# Patient Record
Sex: Male | Born: 1965 | Race: Black or African American | Hispanic: No | Marital: Single
Health system: Midwestern US, Community
[De-identification: ages and names within clinical notes are randomized; demographics above are authoritative.]

## PROBLEM LIST (undated history)

## (undated) DIAGNOSIS — Z87891 Personal history of nicotine dependence: Secondary | ICD-10-CM

## (undated) DIAGNOSIS — Z973 Presence of spectacles and contact lenses: Secondary | ICD-10-CM

## (undated) DIAGNOSIS — K0510 Chronic gingivitis, plaque induced: Secondary | ICD-10-CM

## (undated) DIAGNOSIS — R9431 Abnormal electrocardiogram [ECG] [EKG]: Secondary | ICD-10-CM

## (undated) DIAGNOSIS — Z720 Tobacco use: Secondary | ICD-10-CM

## (undated) HISTORY — DX: Tobacco use: Z72.0

## (undated) HISTORY — DX: Presence of spectacles and contact lenses: Z97.3

## (undated) HISTORY — DX: Abnormal electrocardiogram (ECG) (EKG): R94.31

## (undated) HISTORY — DX: Chronic gingivitis, plaque induced: K05.10

## (undated) HISTORY — DX: Personal history of nicotine dependence: Z87.891

---

## 2012-03-02 DIAGNOSIS — R9431 Abnormal electrocardiogram [ECG] [EKG]: Secondary | ICD-10-CM

## 2012-03-02 HISTORY — DX: Abnormal electrocardiogram (ECG) (EKG): R94.31

## 2012-07-27 ENCOUNTER — Ambulatory Visit
Admission: RE | Admit: 2012-07-27 | Discharge: 2012-07-27 | Disposition: A | Discharge status: Home / Self Care | Payer: BC Managed Care – PPO | Source: Ambulatory Visit | Attending: Medical | Admitting: Medical

## 2012-07-27 ENCOUNTER — Ambulatory Visit (INDEPENDENT_AMBULATORY_CARE_PROVIDER_SITE_OTHER): Payer: BC Managed Care – PPO | Admitting: Medical

## 2012-07-27 ENCOUNTER — Encounter: Payer: Self-pay | Admitting: Medical

## 2012-07-27 VITALS — BP 132/90 | HR 78 | Temp 98.0°F | Resp 16 | Ht 70.5 in | Wt 207.0 lb

## 2012-07-27 DIAGNOSIS — F17200 Nicotine dependence, unspecified, uncomplicated: Secondary | ICD-10-CM

## 2012-07-27 DIAGNOSIS — R079 Chest pain, unspecified: Secondary | ICD-10-CM

## 2012-07-27 DIAGNOSIS — N529 Male erectile dysfunction, unspecified: Secondary | ICD-10-CM

## 2012-07-27 DIAGNOSIS — Z0000 Encounter for general adult medical examination without abnormal findings: Secondary | ICD-10-CM

## 2012-07-27 DIAGNOSIS — Z23 Encounter for immunization: Secondary | ICD-10-CM

## 2012-07-27 DIAGNOSIS — K0510 Chronic gingivitis, plaque induced: Secondary | ICD-10-CM

## 2012-07-27 DIAGNOSIS — R209 Unspecified disturbances of skin sensation: Secondary | ICD-10-CM

## 2012-07-27 DIAGNOSIS — R202 Paresthesia of skin: Secondary | ICD-10-CM

## 2012-07-27 LAB — POCT URINALYSIS DIPSTICK
Ketones, UA: NEGATIVE
Protein, UA: NEGATIVE
Spec Grav, UA: <=1.005
pH, UA: 5

## 2012-07-27 NOTE — Progress Notes (Signed)
Subjective:   HPI  Glen [name] is a 47 y.o. male who presents for a complete physical.  New patient today.  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  Has a [place] address and this is [place] his [place], but mostly out of town all the time.  Preventative care: Last ophthalmology visit:yes- [facility] Last dental visit:n/a Last colonoscopy:n/a Last prostate exam: n/a Last EKG:07/27/12 Last labs:  Prior vaccinations: TD or Tdap:07/27/12 Influenza:n/a Pneumococcal:n/a Shingles/Zostavax:n/a Other:   Advanced directive:n/a Health care power of attorney:n/a Living will:n/a  Concerns: BP elevated of recent - has home cuff.  From time to time getting 150/100s.  Not sure if his cuff is accurate.  Just passed DOT physical 8/13.   Chest pain - in hte past 30mo has had a few intermittent episodes of central chest pain.  Happens at random, not associated with activity, no associated heartburn, SOB, sweats, nausea, tingling, numbness, not particularly having symptoms with activity.  No prior similar.  Goes away after a few minutes.  Sometimes worse with deep breathing.  ED, premature ejaculation - ongoing, sometimes has issues getting full erections too.  No prior ED visits for the same.  Does get some morning erections.  The full erection is not as big as an issue as the premature ejaculation.  Overactive bladder - urinates often in general, day and night.   No prior eval, no hematuria, no odor or abnormal color to urine  Tobacco use - wants to stop.   Cut out cigarettes, been using e-cigarettes. No prior prescription medication to help quit.  Reviewed their medical, surgical, family, social, medication, and allergy history and updated chart as appropriate.   Past Medical History  Diagnosis Date  . Wears glasses     History reviewed. No pertinent past surgical history.  Family History  Problem Relation Age of Onset  . Cancer  Mother     multiple myeloma  . Cancer Father 22    colon  . Thyroid disease Sister   . Hypertension Brother   . Hypertension Paternal Grandmother   . Heart disease Neg Hx   . Stroke Neg Hx   . Diabetes Neg Hx     History   Social History  . Marital Status: Single    Spouse Name: N/A    Number of Children: N/A  . Years of Education: N/A   Occupational History  . Not on file.   Social History Main Topics  . Smoking status: Former Smoker -- 1.00 packs/day for 20 years    Types: Cigarettes    Quit date: 05/02/2012  . Smokeless tobacco: Current User     Comment: does smoke e-cigarettes  . Alcohol Use: Yes     Comment: periodic beers, not weekly  . Drug Use: No  . Sexually Active: Not on file   Other Topics Concern  . Not on file   Social History Narrative   Agnostic.  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   Truck driver.   [employer].   Exercise - limited    No current outpatient prescriptions on file prior to visit.   No current facility-administered medications on file prior to visit.    No Known Allergies   Review of Systems Constitutional: -fever, -chills, -sweats, -unexpected weight change, -decreased appetite, -fatigue Allergy: -sneezing, -itching, -congestion Dermatology: -changing moles, --rash, -lumps ENT: -runny nose, -ear pain, -sore throat, -hoarseness, -  sinus pain, +teeth pain, - ringing in ears, -hearing loss, -nosebleeds Cardiology: +chest pain, -palpitations, -swelling, -difficulty breathing when lying flat, -waking up short of breath Respiratory: -cough, -shortness of breath, -difficulty breathing with exercise or exertion, -wheezing, -coughing up blood Gastroenterology: -abdominal pain, -nausea, -vomiting, -diarrhea, -constipation, -blood in stool, -changes in bowel movement, -difficulty swallowing or eating Hematology: -bleeding, -bruising  Musculoskeletal: -joint aches, -muscle aches, -joint swelling, -back  pain, -neck pain, -cramping, -changes in gait Ophthalmology: denies vision changes, eye redness, itching, discharge Urology: -burning with urination, -difficulty urinating, -blood in urine, +urinary frequency, -urgency, -incontinence Neurology: +headache, -weakness, -tingling, +numbness, -memory loss, -falls, -dizziness Psychology: -depressed mood, -agitation, +sleep problems     Objective:   Physical Exam  Nurse notes and vitals reviewed  General appearance: alert, no distress, WD/WN, [place], mildly overweight Skin:tattoos left arm, few scattered benign appearing macule, no worrisome lesions HEENT: normocephalic, conjunctiva/corneas normal, sclerae anicteric, PERRLA, EOMi, nares patent, no discharge or erythema, pharynx normal Oral cavity: MMM, tongue normal, teeth with obvious stain, some decay, moderate gingival disease Neck: supple, no lymphadenopathy, no thyromegaly, no masses, normal ROM, no bruits Chest: non tender, normal shape and expansion Heart: RRR, normal S1, S2, no murmurs Lungs: CTA bilaterally, no wheezes, rhonchi, or rales Abdomen: +bs, soft, non tender, non distended, no masses, no hepatomegaly, no splenomegaly, no bruits Back: non tender, normal ROM, no scoliosis Musculoskeletal: upper extremities non tender, no obvious deformity, normal ROM throughout, lower extremities non tender, no obvious deformity, normal ROM throughout Extremities: no edema, no cyanosis, no clubbing Pulses: 2+ symmetric, upper and lower extremities, normal cap refill Neurological: alert, oriented x 3, CN2-12 intact, strength normal upper extremities and lower extremities, sensation normal throughout, DTRs 2+ throughout, no cerebellar signs, gait normal Psychiatric: normal affect, behavior normal, pleasant  GU: normal male external genitalia,circumcised, nontender, no masses, no hernia, no lymphadenopathy Rectal: normal tone, prostate WNL, no lesions   Adult ECG Report  Indication: chest pain   Rate: 62 bpm  Rhythm: normal sinus rhythm  QRS Axis: 21 degrees  PR Interval:  QRS Duration: 92ms  QTc:  Conduction Disturbances: first-degree A-V block   Other Abnormalities: none  Patient's cardiac risk factors are: male gender and smoking/ tobacco exposure.  EKG comparison: none  Narrative Interpretation: 1st degree AV block, no other acute changes     Assessment and Plan :      Encounter Diagnoses  Name Primary?  . Routine general medical examination at a health care facility Yes  . Need for Tdap vaccination   . Gingivitis   . Chest pain   . Tobacco use disorder   . Paresthesia   . Erectile dysfunction     Physical exam - discussed healthy lifestyle, diet, exercise, preventative care, vaccinations, and addressed their concerns.    tdap vaccine, VIS and counseling given  Gingivitis - advised dentist f/u  Chest pain - etiology vague, unclear.  No obvious cause.  reviewed EKG, will send for CXR, f/u pending labs  Tobacco use - discussed risks, ways to quit, advised he consider counseling through toll free the hotline and begin OTC nicotine gum  Paresthesia - left ring finger.  Etiology unclear.   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   discusses ways to deal with premature ejaculation.  Advised against medications for now.     Follow-up pending CXR, labs

## 2012-07-28 LAB — LIPID PANEL
Cholesterol: 205 mg/dL — ABNORMAL HIGH (ref 0–200)
HDL: 46 mg/dL (ref >39)
Total CHOL/HDL Ratio: 4.5 Ratio
Triglycerides: 84 mg/dL (ref <150)

## 2012-07-28 LAB — CBC WITH DIFFERENTIAL/PLATELET
Eosinophils Relative: 1 % (ref 0–5)
Lymphocytes Relative: 35 % (ref 12–46)
Lymphs Abs: 2.5 10*3/uL (ref 0.7–4.0)
MCV: 79.1 fL (ref 78.0–100.0)
Neutro Abs: 4.1 10*3/uL (ref 1.7–7.7)
Platelets: 274 10*3/uL (ref 150–400)
RBC: 5.13 MIL/uL (ref 4.22–5.81)
WBC: 7.1 10*3/uL (ref 4.0–10.5)

## 2012-07-28 LAB — COMPREHENSIVE METABOLIC PANEL
CO2: 23 mEq/L (ref 19–32)
Creat: 0.95 mg/dL (ref 0.50–1.35)
Glucose, Bld: 98 mg/dL (ref 70–99)
Total Bilirubin: 1.1 mg/dL (ref 0.3–1.2)
Total Protein: 7.3 g/dL (ref 6.0–8.3)

## 2012-07-28 LAB — TSH: TSH: 1.168 u[IU]/mL (ref 0.350–4.500)

## 2012-11-22 ENCOUNTER — Ambulatory Visit: Payer: BC Managed Care – PPO | Admitting: Family Medicine

## 2012-11-24 ENCOUNTER — Ambulatory Visit: Payer: BC Managed Care – PPO | Admitting: Family Medicine

## 2012-12-02 ENCOUNTER — Ambulatory Visit (INDEPENDENT_AMBULATORY_CARE_PROVIDER_SITE_OTHER): Payer: BC Managed Care – PPO | Admitting: Family Medicine

## 2012-12-02 ENCOUNTER — Encounter: Payer: Self-pay | Admitting: Family Medicine

## 2012-12-02 VITALS — BP 120/78 | HR 70 | Wt 204.0 lb

## 2012-12-02 DIAGNOSIS — L989 Disorder of the skin and subcutaneous tissue, unspecified: Secondary | ICD-10-CM

## 2012-12-02 NOTE — Progress Notes (Signed)
  Subjective:    Patient ID: Glen [name], male    DOB: 05/15/1965, 47 y.o.   MRN: [id number]  HPI He has a lesion on the left chin that has been growing. He has periodically tried to squeeze it but gets nothing but blood.   Review of Systems     Objective:   Physical Exam A 1 cm round erythematous raised lesion is noted on the left chin. No other lesions are noted.       Assessment & Plan:  Facial skin lesion  I wonder if this is a pyogenic granuloma the lesion was injected with Xylocaine and epinephrine. It was shaved without difficulty and sent for pathologic. The base was hyfrecated. He tolerated the procedure well.

## 2012-12-07 ENCOUNTER — Telehealth: Payer: Self-pay

## 2012-12-07 NOTE — Telephone Encounter (Signed)
CALLED PT HE WAS INFORMED AND VERBALIZED UNDERSTANDING THAT HIS BIOPSY IS NORMAL

## 2012-12-08 ENCOUNTER — Encounter: Payer: Self-pay | Admitting: Family Medicine

## 2013-03-02 DIAGNOSIS — Z720 Tobacco use: Secondary | ICD-10-CM

## 2013-03-02 HISTORY — DX: Tobacco use: Z72.0

## 2013-03-06 ENCOUNTER — Encounter: Payer: Self-pay | Admitting: Medical

## 2013-03-06 ENCOUNTER — Ambulatory Visit (INDEPENDENT_AMBULATORY_CARE_PROVIDER_SITE_OTHER): Payer: BC Managed Care – PPO | Admitting: Medical

## 2013-03-06 VITALS — BP 122/90 | Temp 98.1°F | Wt 205.0 lb

## 2013-03-06 DIAGNOSIS — L980 Pyogenic granuloma: Secondary | ICD-10-CM

## 2013-03-06 DIAGNOSIS — R030 Elevated blood-pressure reading, without diagnosis of hypertension: Secondary | ICD-10-CM

## 2013-03-06 NOTE — Progress Notes (Signed)
Subjective: He is here today for recheck on left facial lesion. He saw Dr. Susann GivensLalonde for the same a few months ago.  At that time had shave biopsy and cauterization, sample was sent for pathology. He is here today because the lesion has re grown. It bleeds quite easily when he shaves and aggravates the lesion.  He is also here to discuss elevated blood pressure readings at times. No prior diagnoses of high blood pressure although it has been borderline at times. He is a Naval architecttruck driver  Past Medical History  Diagnosis Date  . Wears glasses    Review of systems as in subjective  Objective: Gen.: Well-developed, well-nourished, overweight, no acute distress, AA male Skin: Left lower face along mandible with 2-3 mm diameter raised erythematous lesion consistent with pyogenic granuloma  Assessment Encounter Diagnoses  Name Primary?  . Pyogenic granuloma of skin Yes  . Elevated blood pressure reading without diagnosis of hypertension    Plan We discussed the skin lesion, risk and benefits of procedure, and at his request proceeded.  Procedure done in conjunction with Dr. Susann GivensLalonde, supervising physician. Cleaned and prepped in usual sterile fashion, used 1% lidocaine with epinephrine for local anesthesia of the left lower facial lesion, approximately 0.2 cc used.  Using sterile razor, performed shave biopsy of the lesion, hyfrecated the base. Estimated blood loss less than 1 cc, patient tolerated procedure well   Elevated blood pressure-advise weight loss, improved diet, salt reduction, routine exercise, good hydration with water. Recheck in 1-2 months. he will keep track of his blood pressure reading

## 2013-08-28 ENCOUNTER — Telehealth: Payer: Self-pay | Admitting: Medical

## 2013-08-28 ENCOUNTER — Encounter: Payer: Self-pay | Admitting: Medical

## 2013-08-28 ENCOUNTER — Ambulatory Visit (INDEPENDENT_AMBULATORY_CARE_PROVIDER_SITE_OTHER): Payer: Self-pay | Admitting: Medical

## 2013-08-28 VITALS — BP 112/80 | HR 92 | Temp 97.6°F | Resp 16 | Ht 70.5 in | Wt 209.0 lb

## 2013-08-28 DIAGNOSIS — Z720 Tobacco use: Secondary | ICD-10-CM

## 2013-08-28 DIAGNOSIS — Z0289 Encounter for other administrative examinations: Secondary | ICD-10-CM

## 2013-08-28 DIAGNOSIS — L608 Other nail disorders: Secondary | ICD-10-CM

## 2013-08-28 DIAGNOSIS — R9431 Abnormal electrocardiogram [ECG] [EKG]: Secondary | ICD-10-CM

## 2013-08-28 DIAGNOSIS — R683 Clubbing of fingers: Secondary | ICD-10-CM

## 2013-08-28 DIAGNOSIS — F17200 Nicotine dependence, unspecified, uncomplicated: Secondary | ICD-10-CM

## 2013-08-28 LAB — POCT URINALYSIS DIPSTICK
Bilirubin, UA: NEGATIVE
GLUCOSE UA: NEGATIVE
KETONES UA: NEGATIVE
Leukocytes, UA: NEGATIVE
Nitrite, UA: NEGATIVE
PROTEIN UA: NEGATIVE
SPEC GRAV UA: 1.02
Urobilinogen, UA: NEGATIVE
pH, UA: 5

## 2013-08-28 NOTE — Telephone Encounter (Signed)
Patient is aware of his appointment to see Dr. Donnie Ahoilley July 6, 15 @ 245 pm. He is also aware of Kristian CoveyShane Tysinger [facility] message in detail. CLS

## 2013-08-28 NOTE — Telephone Encounter (Signed)
What I decided to do was give him a 90 day conditional certificate, but refer to cardiology.     Next steps - refer to Dr. Donnie Ahoilley for baseline eval given abnormal EKG from 2014, clubbing of nails, long term smoker, and pulses 1+ throughout.    Once Dr. Donnie Ahoilley sees him and weighs in, then we can go from there.  If he finds nothing worrisome such as abnormal treadmill test or sleep apnea, then I can potentially give 2 year certificate.   It depends on the evaluation.

## 2013-08-28 NOTE — Progress Notes (Signed)
Commercial Driver Medical Examination   Glen MedinaRodney [name] is a 48 y.o. male who presents today for a commercial driver fitness determination physical exam.  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  Lives mainly in his truck.  Uses truck stops for bathing, toileting.  Has PO box and storage unit here in [place].  Son lives in [place], [place] where his licensed address is.   Medical care team includes:  Kristian CoveyShane Tysinger, PA-C, here for primary care  The patient reports no problems.  Review of Systems A comprehensive review of systems was reviewed and noted as below:  Eye: +corrective lenses, -lasik surgery or other eye surgery, -glaucoma, -cataracts, -macular degeneration, -monocular vision, -medication for eye condition, -blurred vision,   Ears: -hearing problems, - hearing aids, -ear pain, -ear drainage, -ear fullness, -tinnitus, -recurrent ear infection, -previous ear surgery, - vertigo, -menieres disease  Endocrine: -polydipsia, -polyuria, -weight loss, -fainting, -dizziness, - altered or loss of consciousness, -hypoglycemia  Cardiovascular: -heart disease, -CHF, -heart attack, -cardiac stents, -bypass surgery, -other heart surgery, -hypertension, -blood clots, -pacemaker, -medications for heart condition, -chest pain, -SOB, -palpitations, -fainting, -dizziness, -dyspnea  Respiratory: -asthma, -COPD, other lung disease, -smoker, -chest tightness, - wheezing, +snoring, -daytime sleepiness, -sleep apnea or uses CPAP, -narcolepsy, -sleeping disorder  Allergy: -uncontrollable sneezing or allergy symptoms  Musculoskeletal: -missing body parts, -muscle disease, -bone disease, -spine injury, -low back pain, -medication for joints, bones, muscles or pain, -physical limitations, -joint pain, -neck pain, -limitations of neck ROM, -back surgery, orthopedic surgery, -rheumatologic condition, -gout  Neurologic: -neurologic disease, -dementia, -seizures,  -parkinsons, -tremor, -memory problems, -weakness, -numbness, -tingling, -medication for neurologic condition, -medications for sleep condition  Gastric: -abdominal pain, -chronic diarrhea or IBS, -uncontrollable nausea  Kidney/Renal: -hematuria, -dialysis, kidney disease, polycystic kidney disease  Psychiatric: -homicidal thoughts, -suicidal thoughts, -prior suicide attempts, -get into fights/hurting others, -memory or concentration problems, -delusions, -hallucinations, -hospitalizaiton for mental health problem, -depression, -anxiety, -bipolar  Drug use: - none   Reviewed their medical, surgical, family, social, medication, and allergy history and updated chart as appropriate.      Objective:   Physical Exam   BP 112/80  Pulse 92  Temp(Src) 97.6 F (36.4 C) (Oral)  Resp 16  Ht 5' 10.5" (1.791 m)  Wt 209 lb (94.802 kg)  BMI 29.55 kg/m2   General appearance: alert, no distress, WD/WN, AA male Skin: scars of bilat anterior knees from prior trauma, right proximal forearm with oval scar from prior trauma, left upper chest horizontal scar from prior trauma, scorpion tattoo left forearm, no otherw worrisome lesions HEENT: normocephalic, conjunctiva/corneas normal, sclerae anicteric, PERRLA, EOMi, nares patent, no discharge or erythema, pharynx normal Oral cavity: MMM, tongue normal, teeth with some decay, missing several teeth Neck: supple, no lymphadenopathy, no thyromegaly, no masses, normal ROM, no bruits Chest: non tender, normal shape and expansion Heart: RRR, normal S1, S2, no murmurs Lungs: CTA bilaterally, no wheezes, rhonchi, or rales Abdomen: +bs, soft, non tender, non distended, no masses, no hepatomegaly, no splenomegaly, no bruits Back: non tender, normal ROM, no scoliosis Musculoskeletal: upper extremities non tender, no obvious deformity, normal ROM throughout, lower extremities non tender, no obvious deformity, normal ROM throughout Extremities: no edema, no  cyanosis, + clubbing of fingernails Pulses: 2+ symmetric, upper and lower extremities, normal cap refill Neurological: alert, oriented x 3, CN2-12 intact, strength normal upper extremities and lower extremities, sensation normal throughout, DTRs 2+ throughout, no cerebellar signs, gait normal Psychiatric: normal affect, behavior normal, pleasant  GU:  normal male external genitalia, circumcised, nontender, no masses, no hernia, no lymphadenopathy Rectal: deferred  Vision:  Uncorrected Corrected Horizontal Field of Vision  Right Eye n/a 20/20 90 degrees  Left Eye  n/a 20/25 90 degrees  Both Eyes  n/a 20/20    Applicant can recognize and distinguish among traffic control signals and devices showing standard red, green, and amber colors.  Applicant meets visual acuity requirement only when wearing corrective lenses.  Monocular Vision?: No   Hearing: Passed whisper test at 10 feet  Labs: Lab Results  Component Value Date   SPECGRAV <=1.005 07/27/2012   PROTEINUR NEG 07/27/2012   BILIRUBINUR NEG 07/27/2012      Assessment:    Encounter Diagnoses  Name Primary?  . Health examination of defined subpopulation Yes  . Nonspecific abnormal electrocardiogram (ECG) (EKG)   . Clubbing of nail   . Smokeless tobacco use       Plan:    He would otherwise qualify for 2 year certificate.  However, given EKG findings in 2014, smoking history and clubbing of nails, I'd like him to have baseline cardiac evaluation.  I don't think he is at risk of sudden death or risking other's lives while driving at this time.  technicially his prior DOT certificate is still good through August 2015.  Either way, he was given a 90 day conditional certificate today.  Assuming no worrisome findings with cardiology consult, will qualify for 2 years.

## 2013-09-04 ENCOUNTER — Encounter: Payer: Self-pay | Admitting: Cardiology

## 2013-09-04 NOTE — Progress Notes (Signed)
Patient ID: Glen [name], male   DOB: 1965-07-28, 48 y.o.   MRN: [id number]   Glen, [name]    Date of visit:  09/04/2013 DOB:  1966-02-06    Age:  48 yrs. Medical record number:  [id number]     Account number:  [id number] Primary Care Provider: Robyne [name] ____________________________ CURRENT DIAGNOSES  1. Clubbing Of Fingers  2. Other General Medical Examination For Administrative Purposes  3. Other symptoms involving cardiovascular system ____________________________ ALLERGIES  No Known Allergies ____________________________ MEDICATIONS  1. aspirin 81 mg chewable tablet, 1 p.o. daily  2. garlic 8,295 mg capsule, 1 p.o. daily ____________________________ CHIEF COMPLAINTS  Was told had clubbing fingers  told to see cardiologist ____________________________ HISTORY OF PRESENT ILLNESS  This very pleasant 48 year old black male seen for cardiovascular evaluation of possible clubbing of his fingertips. He has an asymptomatic male who has previously been in good health without significant cardiac issues. On a recent DOT physical possible clubbing was noted in the cardiovascular examination was requested. The patient feels well and denies angina. He has no PND, orthopnea, syncope, palpitations, or claudication. He has no history of murmur or of any cardiac disease previously. He has no history of congenital heart disease. He had a normal chest x-ray one year ago that I personally reviewed. He is currently a nonsmoker. He has no history of a myocardial infarction ___________________________ PAST HISTORY  Past Medical Illnesses:  no history of hypertension, diabetes, or hyperlipidemia;  Cardiovascular Illnesses:  no previous history of cardiac disease;  Infectious Diseases:  no previous history of significant infectious diseases;  Surgical Procedures:  no previous surgical procedures;  Trauma History:  no previous history of significant trauma;  Cardiology Procedures-Invasive:  no previous  interventional or invasive cardiology procedures;  Cardiology Procedures-Noninvasive:  no previous non-invasive cardiovascular testing;  Peripheral Vascular Procedures:  no previous invasive peripheral vascular procedures.;  LVEF not documented,   ____________________________ CARDIO-PULMONARY TEST DATES ____________________________ FAMILY HISTORY Brother -- Brother alive with problem, Hypertension Father -- Father alive with problem, Colorectal cancer Mother -- Mother dead, Multiple myeloma Sister -- Sister alive with problem, Chronic obstructive lung disease ____________________________ SOCIAL HISTORY Alcohol Use:  rare;  Smoking:  used to smoke but quit 2013;  Diet:  regular diet;  Lifestyle:  single;  Exercise:  no regular exercise;  Occupation:  truck Geophysicist/field seismologist;   ____________________________ REVIEW OF SYSTEMS General:  denies recent weight change, fatique or change in exercise tolerance.  Integumentary:no rashes or new skin lesions. Eyes: denies diplopia, history of glaucoma or visual problems. Respiratory: denies dyspnea, cough, wheezing or hemoptysis. Cardiovascular:  please review HPI Abdominal: denies dyspepsia, GI bleeding, constipation, or diarrhea Genitourinary-Male: no dysuria, urgency, frequency, or nocturia  Musculoskeletal:  denies arthritis, venous insufficiency, or muscle weakness Neurological:  denies headaches, stroke, or TIA Psychiatric:  denies depession or anxiety  ____________________________ PHYSICAL EXAMINATION VITAL SIGNS  Blood Pressure:  118/70 Sitting, Left arm, large cuff  , 110/70 Standing, Left arm and large cuff   Pulse:  110/min. Weight:  210.00 lbs. Height:  70"BMI: 30  Constitutional:  pleasant African Americian male in no acute distress Skin:  warm and dry to touch, no apparent skin lesions, or masses noted. Head:  normocephalic, normal hair pattern, no masses or tenderness Eyes:  EOMS Intact, PERRLA, C and S clear, Funduscopic exam not done. ENT:  ears,  nose and throat reveal no gross abnormalities.  Dentition good. Neck:  supple, without massess. No JVD, thyromegaly or carotid bruits. Carotid upstroke normal.  Chest:  normal symmetry, clear to auscultation. Cardiac:  regular rhythm, normal S1 and S2, No S3 or S4, no murmurs, gallops or rubs detected. Abdomen:  abdomen soft,non-tender, no masses, no hepatospenomegaly, or aneurysm noted Peripheral Pulses:  the femoral,dorsalis pedis, and posterior tibial pulses are full and equal bilaterally with no bruits auscultated. Extremities & Back:  The nailbed has an angle to it and the fingertips are not rounded or bulbous. No cyanosis. Neurological:  no gross motor or sensory deficits noted, affect appropriate, oriented x3. ____________________________ IMPRESSIONS/PLAN 1. 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 2. What looks like clubbing may be a normal variant in him. This may be pseudoclubbing. He does not have any clinical evidence of congenital heart disease or any other medical conditions that would usually be associated with clubbing.  I don't think he needs to have any further cardiovascular workup. His EKG is normal. ____________________________ TODAYS ORDERS  1. 12 Lead EKG: Today                       ____________________________ Cardiology Physician:  Kerry Hough MD [facility]

## 2013-09-25 ENCOUNTER — Telehealth: Payer: Self-pay | Admitting: Medical

## 2013-09-25 NOTE — Telephone Encounter (Signed)
pls call and inquire 

## 2013-09-26 NOTE — Telephone Encounter (Signed)
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 Whatever happens with your health in the future if it needs to be reported to DOT it will be done. He ask me my opinion and I told him if you don't have anything to hide the you should be fine have [name] family [facility] as well as [name] doing your [facility] physical. CLS

## 2013-09-26 NOTE — Telephone Encounter (Signed)
Patient called with concerns that [facility] [name] [facility] is his family physician and he also see's him for his DOT physical's as well. He states that he was talking with other DOT driver's and they told him he mess up and he should be seeing a different person for his DOT physical. CLS

## 2013-09-26 NOTE — Telephone Encounter (Signed)
I tried to call this patient , but there was a bad cell phone connection. I ask the patient to call back once he is in a better location. CLS

## 2014-10-31 IMAGING — CR DG CHEST 2V
2 series · 2 of 2 positions shown · non-contrast
Comparison: None

CLINICAL DATA: Smoker, chest pain, shortness of breath.

CHEST - 2 VIEW

[w chest pa]
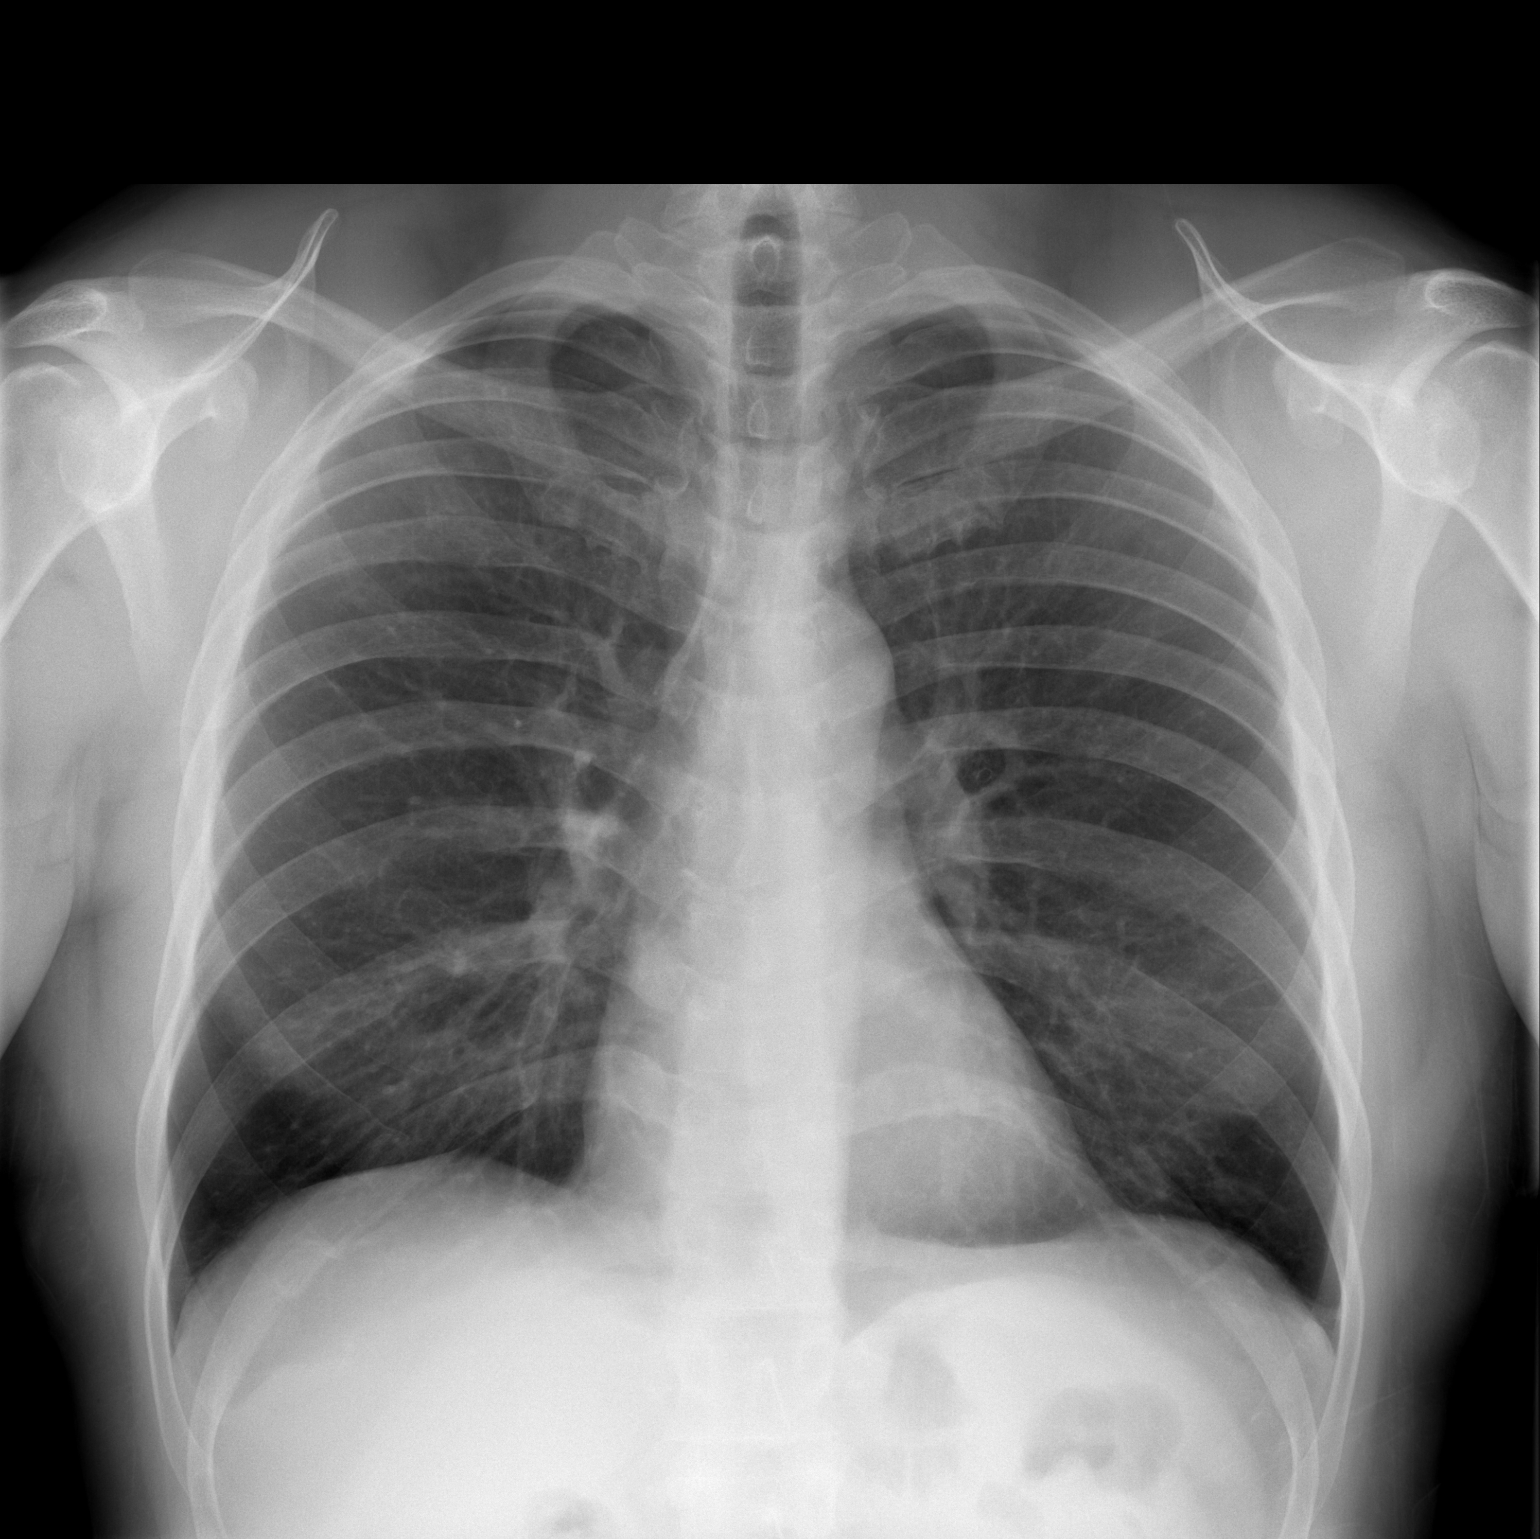

[w chest lat]
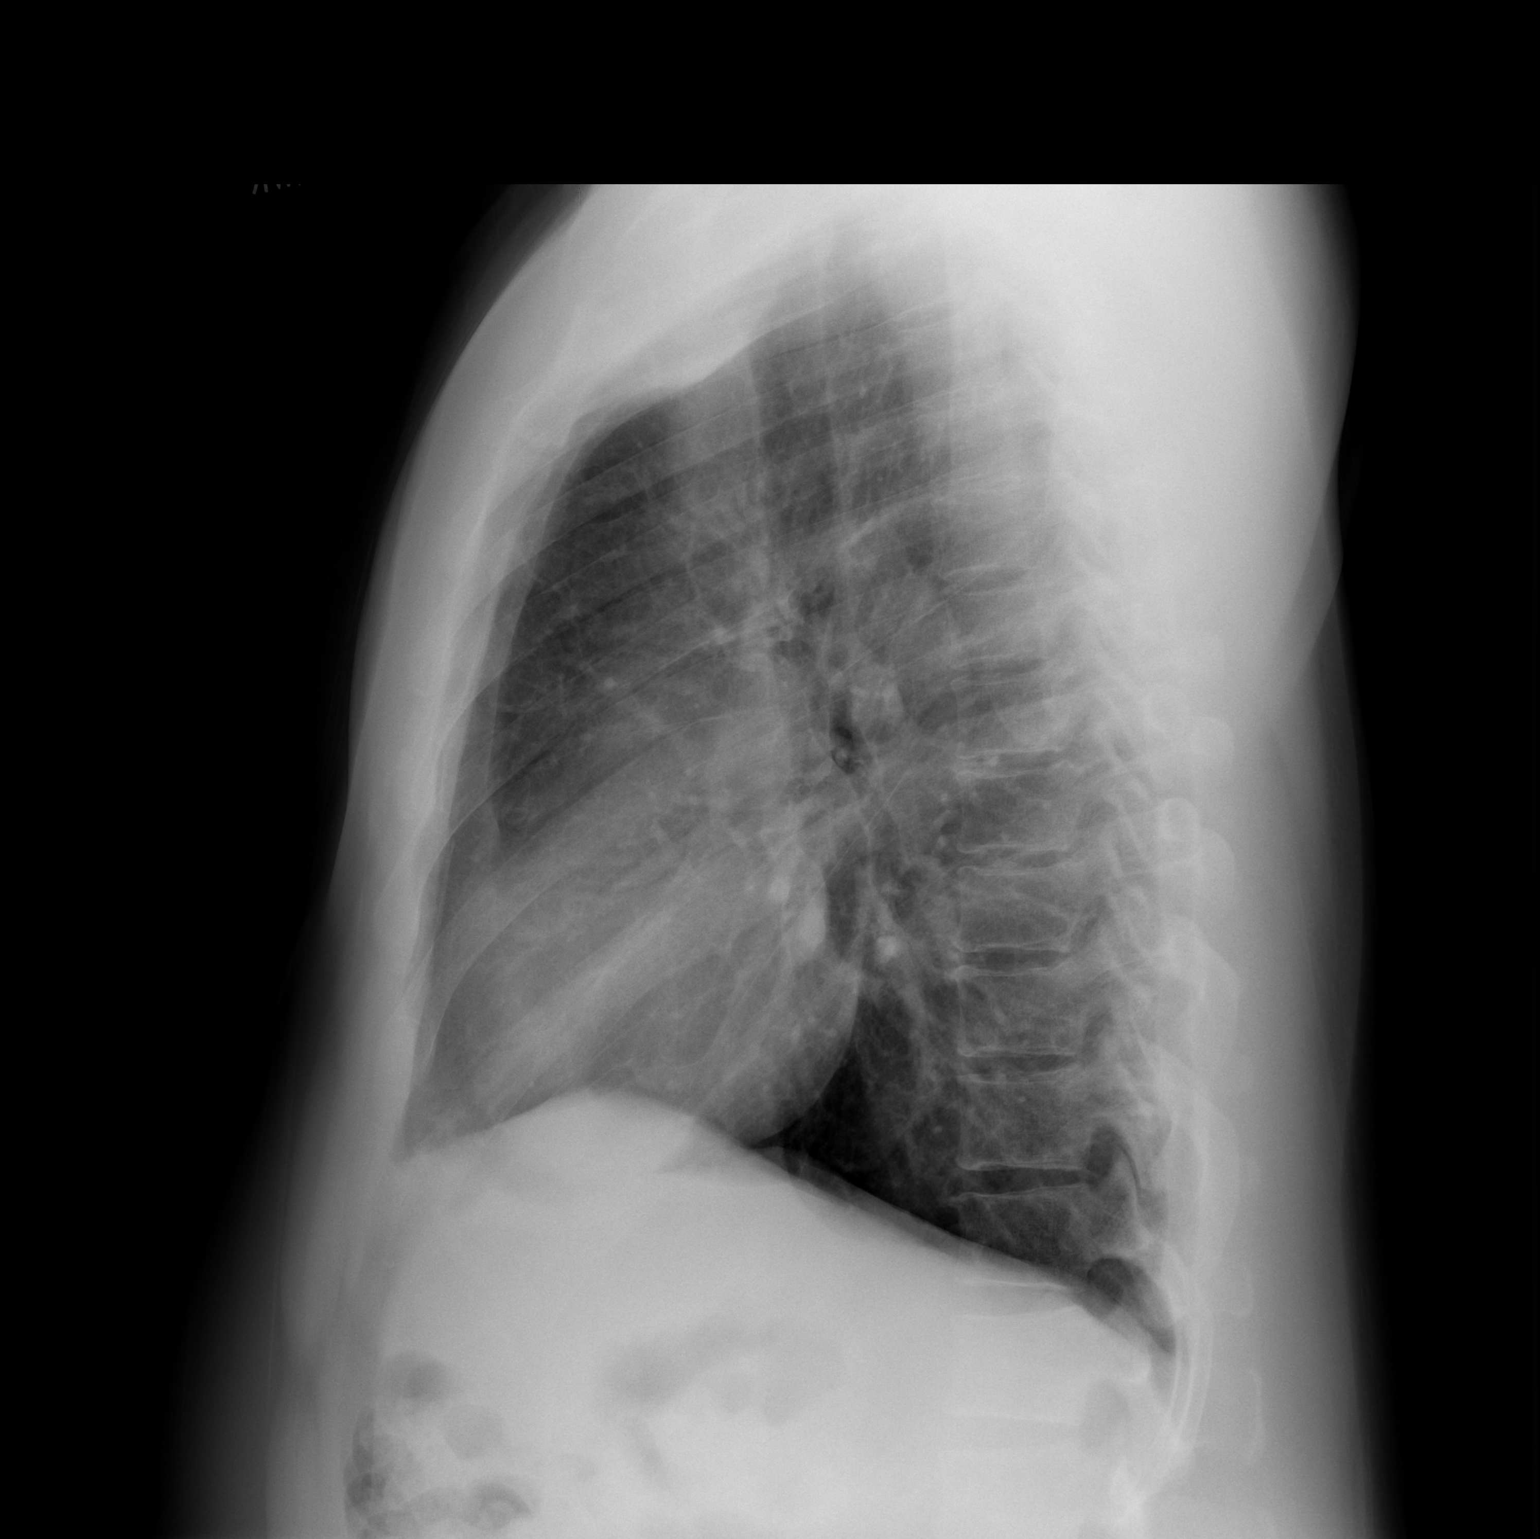

[2 of 2 positions shown; findings below may reference images not displayed]

FINDINGS: Heart and mediastinal contours are within normal limits.
No focal opacities or effusions.  No acute bony abnormality.
IMPRESSION: No active cardiopulmonary disease.

## 2015-06-10 ENCOUNTER — Ambulatory Visit
Admit: 2015-06-10 | Discharge: 2015-06-10 | Payer: BLUE CROSS/BLUE SHIELD | Attending: Family Medicine | Primary: Family Medicine

## 2015-06-10 DIAGNOSIS — I10 Essential (primary) hypertension: Secondary | ICD-10-CM

## 2015-06-10 LAB — AMB POC URINALYSIS DIP STICK MANUAL W/O MICRO
Bilirubin (UA POC): NEGATIVE
Glucose (UA POC): NEGATIVE
Leukocyte esterase (UA POC): NEGATIVE
Nitrites (UA POC): NEGATIVE
Specific gravity (UA POC): 1.02 (ref 1.001–1.035)
Urobilinogen (UA POC): 1 (ref 0.2–1)
pH (UA POC): 6 (ref 4.6–8.0)

## 2015-06-10 MED ORDER — AMLODIPINE 10 MG TAB
10 mg | ORAL_TABLET | Freq: Every day | ORAL | 0 refills | Status: AC
Start: 2015-06-10 — End: ?

## 2015-06-10 NOTE — Progress Notes (Signed)
HPI  Paul Foley is a 50 y.o.  male presenting for establishing care.  He has been hypertensive for several years and has been out of his amlodipine for the last week or more.  Other than occasional urgency of urination he has no complaints.        Review of Systems   Constitutional: Negative for fever, malaise/fatigue and weight loss.   HENT: Negative for congestion, ear pain, nosebleeds and sore throat.    Eyes: Negative for blurred vision, pain and redness.   Respiratory: Negative for cough, shortness of breath and wheezing.    Cardiovascular: Negative for chest pain, palpitations, claudication and leg swelling.   Gastrointestinal: Negative for abdominal pain, blood in stool, constipation, diarrhea, heartburn, melena and nausea.   Genitourinary: Positive for urgency. Negative for dysuria, flank pain, frequency and hematuria.   Musculoskeletal: Negative for back pain, falls, joint pain, myalgias and neck pain.   Skin: Negative for rash.   Neurological: Negative for dizziness, tingling, tremors, sensory change, focal weakness, loss of consciousness, weakness and headaches.   Endo/Heme/Allergies: Negative for polydipsia. Does not bruise/bleed easily.   Psychiatric/Behavioral: Negative for depression and memory loss. The patient is not nervous/anxious.      Current Outpatient Prescriptions   Medication Sig Dispense Refill   ??? aspirin delayed-release 81 mg tablet Take  by mouth daily.     ??? fenugreek seed extract 500 mg cap Take  by mouth.     ??? amLODIPine (NORVASC) 10 mg tablet Take 1 Tab by mouth daily. 90 Tab 0      No Known Allergies  Past Medical History:   Diagnosis Date   ??? Hypertension      History reviewed. No pertinent surgical history.  No family history on file.   Social History     Social History   ??? Marital status: SINGLE     Spouse name: N/A   ??? Number of children: N/A   ??? Years of education: N/A     Occupational History   ??? Not on file.     Social History Main Topics    ??? Smoking status: Former Smoker     Types: Cigarettes     Quit date: 12/09/2012   ??? Smokeless tobacco: Never Used   ??? Alcohol use Yes      Comment: socially   ??? Drug use: Yes     Special: Prescription   ??? Sexual activity: Yes     Partners: Female     Birth control/ protection: Condom     Other Topics Concern   ??? Not on file     Social History Narrative   ??? No narrative on file         PE  Visit Vitals   ??? BP (!) 133/97 (BP 1 Location: Left arm, BP Patient Position: Sitting)   ??? Pulse (!) 108   ??? Temp 98.4 ??F (36.9 ??C) (Oral)   ??? Resp 18   ??? Ht 5' 10.5" (1.791 m)   ??? Wt 209 lb (94.8 kg)   ??? SpO2 97%   ??? BMI 29.56 kg/m2     Physical Exam   Constitutional: He is oriented to person, place, and time. He appears well-developed and well-nourished. He is active and cooperative. No distress.   HENT:   Head: Normocephalic.   Right Ear: External ear normal.   Left Ear: External ear normal.   Nose: Nose normal.   Mouth/Throat: Oropharynx is clear and moist.   Eyes:  Conjunctivae and EOM are normal. Pupils are equal, round, and reactive to light. Right conjunctiva is not injected. No scleral icterus.   Neck: Normal range of motion. Neck supple. No JVD present. No tracheal tenderness and no muscular tenderness present. Carotid bruit is not present. No tracheal deviation, no edema and normal range of motion present. No thyromegaly present.   Cardiovascular: Normal rate, regular rhythm, S1 normal, S2 normal, normal heart sounds and intact distal pulses.   No extrasystoles are present. PMI is not displaced.  Exam reveals no gallop and no friction rub.    No murmur heard.  Pulmonary/Chest: Effort normal and breath sounds normal. He exhibits no tenderness.   Abdominal: Soft. Bowel sounds are normal. He exhibits no distension, no abdominal bruit and no mass. There is no hepatosplenomegaly. There is no tenderness. There is no rigidity, no CVA tenderness, no tenderness at McBurney's point and negative Murphy's sign. No hernia.    Musculoskeletal: Normal range of motion. He exhibits no edema or tenderness.        Left shoulder: He exhibits normal range of motion, no tenderness, no bony tenderness, no swelling, no crepitus, no deformity, no pain, normal pulse and normal strength.   Lymphadenopathy:     He has no cervical adenopathy.        Right cervical: No superficial cervical and no posterior cervical adenopathy present.       Left cervical: No superficial cervical and no posterior cervical adenopathy present.   Neurological: He is alert and oriented to person, place, and time. He displays no atrophy and no tremor. No cranial nerve deficit or sensory deficit. He exhibits normal muscle tone. Coordination and gait normal.   Skin: Skin is warm and dry. No bruising, no ecchymosis, no lesion, no petechiae and no rash noted. No cyanosis or erythema. No pallor. Nails show no clubbing.   Psychiatric: He has a normal mood and affect. His speech is normal and behavior is normal. Judgment and thought content normal. His mood appears not anxious. His affect is not angry, not labile and not inappropriate. Cognition and memory are normal. He does not exhibit a depressed mood.   Vitals reviewed.      A/P  1. Essential hypertension  As below  - amLODIPine (NORVASC) 10 mg tablet; Take 1 Tab by mouth daily.  Dispense: 90 Tab; Refill: 0  - AMB POC URINALYSIS DIP STICK MANUAL W/O MICRO  - METABOLIC PANEL, COMPREHENSIVE  - LIPID PANEL WITH LDL/HDL RATIO    2. Urgency of urination  Evaluate as below  - AMB POC URINALYSIS DIP STICK MANUAL W/O MICRO  - METABOLIC PANEL, COMPREHENSIVE  - PSA, TOTAL &  FREE    No results found for this visit on 06/10/15.     Rosezena Sensor, MD  Note has been electronically signed by provider

## 2015-06-11 LAB — PSA, TOTAL &  FREE
PSA, % Free: 14 %
PSA, Free: 0.21 ng/mL
Prostate Specific Ag: 1.5 ng/mL (ref 0.0–4.0)

## 2015-06-11 LAB — METABOLIC PANEL, COMPREHENSIVE
A-G Ratio: 1.7 (ref 1.2–2.2)
ALT (SGPT): 41 IU/L (ref 0–44)
AST (SGOT): 41 IU/L — ABNORMAL HIGH (ref 0–40)
Albumin: 4.6 g/dL (ref 3.5–5.5)
Alk. phosphatase: 63 IU/L (ref 39–117)
BUN/Creatinine ratio: 20 (ref 9–20)
BUN: 21 mg/dL (ref 6–24)
Bilirubin, total: 0.9 mg/dL (ref 0.0–1.2)
CO2: 23 mmol/L (ref 18–29)
Calcium: 9.2 mg/dL (ref 8.7–10.2)
Chloride: 101 mmol/L (ref 96–106)
Creatinine: 1.06 mg/dL (ref 0.76–1.27)
GFR est AA: 96 mL/min/{1.73_m2} (ref >59)
GFR est non-AA: 83 mL/min/{1.73_m2} (ref >59)
GLOBULIN, TOTAL: 2.7 g/dL (ref 1.5–4.5)
Glucose: 126 mg/dL — ABNORMAL HIGH (ref 65–99)
Potassium: 5.1 mmol/L (ref 3.5–5.2)
Protein, total: 7.3 g/dL (ref 6.0–8.5)
Sodium: 142 mmol/L (ref 134–144)

## 2015-06-11 LAB — LIPID PANEL WITH LDL/HDL RATIO
Cholesterol, total: 200 mg/dL — ABNORMAL HIGH (ref 100–199)
HDL Cholesterol: 57 mg/dL (ref >39)
LDL, calculated: 100 mg/dL — ABNORMAL HIGH (ref 0–99)
LDL/HDL Ratio: 1.8 ratio units (ref 0.0–3.6)
Triglyceride: 216 mg/dL — ABNORMAL HIGH (ref 0–149)
VLDL, calculated: 43 mg/dL — ABNORMAL HIGH (ref 5–40)

## 2015-06-11 NOTE — Progress Notes (Signed)
Slight elevation of glucose to 126 with a high normal of 100 and minimal elevation of one liver enzyme that needs to be followed up on within the month.

## 2015-06-12 ENCOUNTER — Encounter: Attending: Family Medicine | Primary: Family Medicine

## 2015-06-12 NOTE — Progress Notes (Signed)
Called and reviewed labs, patient expressed understanding. Patient wanted to schedule for 08/13/15.

## 2015-06-12 NOTE — Progress Notes (Signed)
Called and left a VM for a call back.

## 2015-08-13 ENCOUNTER — Encounter: Attending: Family Medicine | Primary: Family Medicine

## 2015-08-23 ENCOUNTER — Encounter: Attending: Family Medicine | Primary: Family Medicine
